# Patient Record
Sex: Male | Born: 1980 | Race: White | Hispanic: No | Marital: Married | Smoking: Light tobacco smoker
Health system: Southern US, Community
[De-identification: ages and names within clinical notes are randomized; demographics above are authoritative.]

## PROBLEM LIST (undated history)

## (undated) DIAGNOSIS — T6701XA Heatstroke and sunstroke, initial encounter: Secondary | ICD-10-CM

---
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
## 2005-07-02 ENCOUNTER — Emergency Department (HOSPITAL_COMMUNITY): Admission: EM | Admit: 2005-07-02 | Discharge: 2005-07-02 | Payer: Self-pay | Admitting: Emergency Medicine
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
## 2015-07-09 ENCOUNTER — Emergency Department (HOSPITAL_COMMUNITY)
Admission: EM | Admit: 2015-07-09 | Discharge: 2015-07-09 | Disposition: A | Discharge status: Home / Self Care | Payer: Self-pay | Attending: Emergency Medicine | Admitting: Emergency Medicine

## 2015-07-09 ENCOUNTER — Encounter (HOSPITAL_COMMUNITY): Payer: Self-pay | Admitting: Adult Health

## 2015-07-09 DIAGNOSIS — F17210 Nicotine dependence, cigarettes, uncomplicated: Secondary | ICD-10-CM | POA: Insufficient documentation

## 2015-07-09 DIAGNOSIS — R591 Generalized enlarged lymph nodes: Secondary | ICD-10-CM

## 2015-07-09 DIAGNOSIS — Z87828 Personal history of other (healed) physical injury and trauma: Secondary | ICD-10-CM | POA: Insufficient documentation

## 2015-07-09 DIAGNOSIS — L0889 Other specified local infections of the skin and subcutaneous tissue: Secondary | ICD-10-CM | POA: Insufficient documentation

## 2015-07-09 DIAGNOSIS — R590 Localized enlarged lymph nodes: Secondary | ICD-10-CM | POA: Insufficient documentation

## 2015-07-09 HISTORY — DX: Heatstroke and sunstroke, initial encounter: T67.01XA

## 2015-07-09 LAB — COMPREHENSIVE METABOLIC PANEL
ALT: 20 U/L (ref 17–63)
AST: 17 U/L (ref 15–41)
Albumin: 3.7 g/dL (ref 3.5–5.0)
Alkaline Phosphatase: 55 U/L (ref 38–126)
Anion gap: 10 (ref 5–15)
BUN: 13 mg/dL (ref 6–20)
CO2: 25 mmol/L (ref 22–32)
Calcium: 9 mg/dL (ref 8.9–10.3)
Chloride: 107 mmol/L (ref 101–111)
Creatinine, Ser: 1.04 mg/dL (ref 0.61–1.24)
GFR calc Af Amer: >60 mL/min (ref >60)
GFR calc non Af Amer: >60 mL/min (ref >60)
Glucose, Bld: 109 mg/dL — ABNORMAL HIGH (ref 65–99)
Potassium: 3.8 mmol/L (ref 3.5–5.1)
Sodium: 142 mmol/L (ref 135–145)
Total Bilirubin: 0.4 mg/dL (ref 0.3–1.2)
Total Protein: 6.4 g/dL — ABNORMAL LOW (ref 6.5–8.1)

## 2015-07-09 LAB — URINALYSIS, ROUTINE W REFLEX MICROSCOPIC
Bilirubin Urine: NEGATIVE
Glucose, UA: NEGATIVE mg/dL
Hgb urine dipstick: NEGATIVE
Ketones, ur: NEGATIVE mg/dL
Leukocytes, UA: NEGATIVE
Nitrite: NEGATIVE
Protein, ur: NEGATIVE mg/dL
Specific Gravity, Urine: 1.036 — ABNORMAL HIGH (ref 1.005–1.030)
pH: 5.5 (ref 5.0–8.0)

## 2015-07-09 LAB — CBC WITH DIFFERENTIAL/PLATELET
Basophils Absolute: 0.1 10*3/uL (ref 0.0–0.1)
Basophils Relative: 1 %
Eosinophils Absolute: 0.2 10*3/uL (ref 0.0–0.7)
Eosinophils Relative: 2 %
HCT: 39.3 % (ref 39.0–52.0)
Hemoglobin: 13.3 g/dL (ref 13.0–17.0)
Lymphocytes Relative: 31 %
Lymphs Abs: 2.7 10*3/uL (ref 0.7–4.0)
MCH: 27.1 pg (ref 26.0–34.0)
MCHC: 33.8 g/dL (ref 30.0–36.0)
MCV: 80 fL (ref 78.0–100.0)
Monocytes Absolute: 1 10*3/uL (ref 0.1–1.0)
Monocytes Relative: 12 %
Neutro Abs: 4.6 10*3/uL (ref 1.7–7.7)
Neutrophils Relative %: 54 %
Platelets: 170 10*3/uL (ref 150–400)
RBC: 4.91 MIL/uL (ref 4.22–5.81)
RDW: 13.8 % (ref 11.5–15.5)
WBC: 8.6 10*3/uL (ref 4.0–10.5)

## 2015-07-09 MED ORDER — DOXYCYCLINE HYCLATE 100 MG PO CAPS: 100.0000 mg | ORAL_CAPSULE | Freq: Two times a day (BID) | ORAL | Status: AC

## 2015-07-09 NOTE — ED Provider Notes (Signed)
CSN: [id number]     Arrival date & time 07/09/15  1920 History   First MD Initiated Contact with Patient 07/09/15 2052     Chief Complaint  Patient presents with  . Groin Pain     (Consider location/radiation/quality/duration/timing/severity/associated sxs/prior Treatment) Patient is a 35 y.o. male presenting with groin pain. The history is provided by the patient.  Groin Pain This is a new problem. The current episode started yesterday. The problem occurs constantly. The problem has been gradually worsening. Pertinent negatives include no chest pain, no abdominal pain, no headaches and no shortness of breath. Nothing aggravates the symptoms. Nothing relieves the symptoms. He has tried nothing for the symptoms. The treatment provided no relief.   35 yo M With a chief complaint of left groin pain. This been going on for the past couple days. Patient had noted a couple ticks on his left lower extremity over the past week or so. 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 Has been finding multiple ticks on his body. Only 2 were actually engorged. He states he checks himself every day and so is unlikely that there would be one there for longer than 48 hours. Denies any rashes. Denies fevers or chills. Denies joint pain. Had noticed a lump in his groin that has gotten more painful and swollen. Painful with palpation walking. Denies any testicular pain dysuria and increased frequency. Denies history of cancer.  Past Medical History  Diagnosis Date  . Heat stroke    History reviewed. No pertinent past surgical history. History reviewed. No pertinent family history. Social History  Substance Use Topics  . Smoking status: Light Tobacco Smoker    Types: Cigars  . Smokeless tobacco: None  . Alcohol Use: No    Review of Systems  Constitutional: Negative for fever and chills.  HENT: Negative for congestion and facial swelling.   Eyes: Negative for discharge and visual disturbance.   Respiratory: Negative for shortness of breath.   Cardiovascular: Negative for chest pain and palpitations.  Gastrointestinal: Negative for vomiting, abdominal pain and diarrhea.  Genitourinary:       Left inguinal groin pain  Musculoskeletal: Negative for myalgias and arthralgias.  Skin: Negative for color change and rash.  Neurological: Negative for tremors, syncope and headaches.  Psychiatric/Behavioral: Negative for confusion and dysphoric mood.      Allergies  Shellfish allergy  Home Medications   Prior to Admission medications   Medication Sig Start Date End Date Taking? Authorizing Provider  ibuprofen (ADVIL,MOTRIN) 200 MG tablet Take 600 mg by mouth every 6 (six) hours as needed for moderate pain.   Yes Historical Provider, MD  doxycycline (VIBRAMYCIN) 100 MG capsule Take 1 capsule (100 mg total) by mouth 2 (two) times daily. 07/09/15   Melene Plan, DO   BP 133/83 mmHg  Pulse 67  Temp(Src) 98.3 F (36.8 C) (Oral)  Resp 16  SpO2 99% Physical Exam  Constitutional: He is oriented to person, place, and time. He appears well-developed and well-nourished.  HENT:  Head: Normocephalic and atraumatic.  Eyes: EOM are normal. Pupils are equal, round, and reactive to light.  Neck: Normal range of motion. Neck supple. No JVD present.  Cardiovascular: Normal rate and regular rhythm.  Exam reveals no gallop and no friction rub.   No murmur heard. Pulmonary/Chest: No respiratory distress. He has no wheezes.  Abdominal: He exhibits no distension. There is no rebound and no guarding.  Musculoskeletal: Normal range of motion.  Multiple small pustules about  bilateral lower extremities. No noted bull's-eye rashes. Large tender lymph node noted to the left inguinal area.  Neurological: He is alert and oriented to person, place, and time.  Skin: No rash noted. No pallor.  Psychiatric: He has a normal mood and affect. His behavior is normal.  Nursing note and vitals reviewed.   ED Course   Procedures (including critical care time) Labs Review Labs Reviewed  URINALYSIS, ROUTINE W REFLEX MICROSCOPIC (NOT AT [facility]) - Abnormal; Notable for the following:    Specific Gravity, Urine 1.036 (*)    All other components within normal limits  COMPREHENSIVE METABOLIC PANEL - Abnormal; Notable for the following:    Glucose, Bld 109 (*)    Total Protein 6.4 (*)    All other components within normal limits  CBC WITH DIFFERENTIAL/PLATELET    Imaging Review No results found. I have personally reviewed and evaluated these images and lab results as part of my medical decision-making.   EKG Interpretation None      MDM   Final diagnoses:  Lymphadenopathy    35 yo M with a chief complaint of a swollen lymph node. This is most likely due to the recent attached ticks. With isolated lymph node involvement and no other symptoms feel like [facility] spotted fever or Lyme disease is probably unlikely. The patient could have tularemia. We'll treat him with doxycycline. If he has ulceration of the lesion he'll return for change in antibiotic.  11:06 PM:  I have discussed the diagnosis/risks/treatment options with the patient and family and believe the pt to be eligible for discharge home to follow-up with PCP. We also discussed returning to the ED immediately if new or worsening sx occur. We discussed the sx which are most concerning (e.g., sudden worsening pain, fever,joint pain, ulceration) that necessitate immediate return. Medications administered to the patient during their visit and any new prescriptions provided to the patient are listed below.  Medications given during this visit Medications - No data to display  Discharge Medication List as of 07/09/2015 10:20 PM    START taking these medications   Details  doxycycline (VIBRAMYCIN) 100 MG capsule Take 1 capsule (100 mg total) by mouth 2 (two) times daily., Starting 07/09/2015, Until Discontinued, Print        The patient appears  reasonably screen and/or stabilized for discharge and I doubt any other medical condition or other [facility] requiring further screening, evaluation, or treatment in the ED at this time prior to discharge.      Melene Planan Lashann Hagg, DO 07/09/15 2308

## 2015-07-09 NOTE — ED Notes (Addendum)
Presents with indurated area to left groin, began last Monday, lump has gotten larger and more painful, no redness or warmth noted. Tender to touch. No problems with urination. Denies any testicular involvement or penile discharge. Lump is about the size of half dollar at lymph area in left groin. Denies recent illness. Steve Rice been bitten by 2 ticks in the past 14 days. Denies rashes, fevers, nausea and vomiting.  The pain is worse when standing and goes into left leg.

## 2015-07-09 NOTE — Discharge Instructions (Signed)
You have a swollen lymph node. I think is most likely due to the multiple tick bites. You need to wear long pants. He also need to use insect repellent. Take the antibiotic for possible infection. Follow-up with a family doctor. Lymphadenopathy Lymphadenopathy refers to swollen or enlarged lymph glands, also called lymph nodes. Lymph glands are part of your body's defense (immune) system, which protects the body from infections, germs, and diseases. Lymph glands are found in many locations in your body, including the neck, underarm, and groin.  Many things can cause lymph glands to become enlarged. When your immune system responds to germs, such as viruses or bacteria, infection-fighting cells and fluid build up. This causes the glands to grow in size. Usually, this is not something to worry about. The swelling and any soreness often go away without treatment. However, swollen lymph glands can also be caused by a number of diseases. Your health care provider may do various tests to help determine the cause. If the cause of your swollen lymph glands cannot be found, it is important to monitor your condition to make sure the swelling goes away. HOME CARE INSTRUCTIONS Watch your condition for any changes. The following actions may help to lessen any discomfort you are feeling:  Get plenty of rest.  Take medicines only as directed by your health care provider. Your health care provider may recommend over-the-counter medicines for pain.  Apply moist heat compresses to the site of swollen lymph nodes as directed by your health care provider. This can help reduce any pain.  Check your lymph nodes daily for any changes.  Keep all follow-up visits as directed by your health care provider. This is important. SEEK MEDICAL CARE IF:  Your lymph nodes are still swollen after 2 weeks.  Your swelling increases or spreads to other areas.  Your lymph nodes are hard, seem fixed to the skin, or are growing  rapidly.  Your skin over the lymph nodes is red and inflamed.  You have a fever.  You have chills.  You have fatigue.  You develop a sore throat.  You have abdominal pain.  You have weight loss.  You have night sweats. SEEK IMMEDIATE MEDICAL CARE IF:  You notice fluid leaking from the area of the enlarged lymph node.  You have severe pain in any area of your body.  You have chest pain.  You have shortness of breath.   This information is not intended to replace advice given to you by your health care provider. Make sure you discuss any questions you have with your health care provider.   Document Released: 01/02/2008 Document Revised: 04/15/2014 Document Reviewed: 10/28/2013 Elsevier Interactive Patient Education [employer] Inc[employer]

## 2019-07-05 ENCOUNTER — Ambulatory Visit: Payer: Self-pay | Attending: Internal Medicine

## 2019-07-05 DIAGNOSIS — Z23 Encounter for immunization: Secondary | ICD-10-CM

## 2019-07-05 NOTE — Progress Notes (Signed)
   Covid-19 Vaccination Clinic  Name:  COSTON MANDATO    MRN: [id number] DOB: Oct 24, 1980  07/05/2019  Mr. Hissong was observed post Covid-19 immunization for 30 minutes based on pre-vaccination screening without incident. He was provided with Vaccine Information Sheet and instruction to access the V-Safe system.   Mr. Puls was instructed to call 911 with any severe reactions post vaccine: Marland Kitchen Difficulty breathing  . Swelling of face and throat  . A fast heartbeat  . A bad rash all over body  . Dizziness and weakness   Immunizations Administered    Name Date Dose VIS Date Route   Pfizer COVID-19 Vaccine 07/05/2019  2:33 PM 0.3 mL 03/19/2019 Intramuscular   Manufacturer: [employer], [employer]   Lot: [id number]   NDC: [address]-4

## 2019-07-28 ENCOUNTER — Ambulatory Visit: Payer: Self-pay | Attending: Internal Medicine

## 2019-07-28 DIAGNOSIS — Z23 Encounter for immunization: Secondary | ICD-10-CM

## 2019-07-28 NOTE — Progress Notes (Signed)
   Covid-19 Vaccination Clinic  Name:  Steve Rice    MRN: [id number] DOB: July 31, 1980  07/28/2019  Steve Rice was observed post Covid-19 immunization for 30 minutes based on pre-vaccination screening without incident. He was provided with Vaccine Information Sheet and instruction to access the V-Safe system.   Steve Rice was instructed to call 911 with any severe reactions post vaccine: Marland Kitchen Difficulty breathing  . Swelling of face and throat  . A fast heartbeat  . A bad rash all over body  . Dizziness and weakness   Immunizations Administered    Name Date Dose VIS Date Route   Pfizer COVID-19 Vaccine 07/28/2019 10:25 AM 0.3 mL 06/02/2018 Intramuscular   Manufacturer: [employer], [employer]   Lot: [id number]   NDC: [address]-5
